# Patient Record
Sex: Female | Born: 1980 | Race: White | Hispanic: No | Marital: Married | Smoking: Never smoker
Health system: Southern US, Community
[De-identification: ages and names within clinical notes are randomized; demographics above are authoritative.]

## PROBLEM LIST (undated history)

## (undated) ENCOUNTER — Inpatient Hospital Stay (HOSPITAL_COMMUNITY): Payer: Self-pay

## (undated) HISTORY — PX: CHOLECYSTECTOMY: SHX55

---

## 1999-06-29 ENCOUNTER — Emergency Department (HOSPITAL_COMMUNITY): Admission: EM | Admit: 1999-06-29 | Discharge: 1999-06-30 | Payer: Self-pay | Admitting: *Deleted

## 1999-07-06 ENCOUNTER — Other Ambulatory Visit: Admission: RE | Admit: 1999-07-06 | Discharge: 1999-07-06 | Payer: Self-pay | Admitting: Obstetrics and Gynecology

## 2000-07-13 ENCOUNTER — Other Ambulatory Visit: Admission: RE | Admit: 2000-07-13 | Discharge: 2000-07-13 | Payer: Self-pay | Admitting: Obstetrics and Gynecology

## 2000-10-24 ENCOUNTER — Emergency Department (HOSPITAL_COMMUNITY): Admission: EM | Admit: 2000-10-24 | Discharge: 2000-10-25 | Payer: Self-pay | Admitting: Emergency Medicine

## 2001-07-20 ENCOUNTER — Other Ambulatory Visit: Admission: RE | Admit: 2001-07-20 | Discharge: 2001-07-20 | Payer: Self-pay | Admitting: Obstetrics and Gynecology

## 2001-07-22 ENCOUNTER — Emergency Department (HOSPITAL_COMMUNITY): Admission: EM | Admit: 2001-07-22 | Discharge: 2001-07-22 | Payer: Self-pay | Admitting: Emergency Medicine

## 2002-07-23 ENCOUNTER — Other Ambulatory Visit: Admission: RE | Admit: 2002-07-23 | Discharge: 2002-07-23 | Payer: Self-pay | Admitting: Obstetrics and Gynecology

## 2003-10-10 ENCOUNTER — Other Ambulatory Visit: Admission: RE | Admit: 2003-10-10 | Discharge: 2003-10-10 | Payer: Self-pay | Admitting: Obstetrics and Gynecology

## 2004-12-17 ENCOUNTER — Other Ambulatory Visit: Admission: RE | Admit: 2004-12-17 | Discharge: 2004-12-17 | Payer: Self-pay | Admitting: Obstetrics and Gynecology

## 2005-12-26 ENCOUNTER — Other Ambulatory Visit: Admission: RE | Admit: 2005-12-26 | Discharge: 2005-12-26 | Payer: Self-pay | Admitting: Obstetrics & Gynecology

## 2007-01-19 ENCOUNTER — Other Ambulatory Visit: Admission: RE | Admit: 2007-01-19 | Discharge: 2007-01-19 | Payer: Self-pay | Admitting: Obstetrics & Gynecology

## 2008-02-21 ENCOUNTER — Emergency Department (HOSPITAL_COMMUNITY): Admission: EM | Admit: 2008-02-21 | Discharge: 2008-02-21 | Payer: Self-pay | Admitting: Emergency Medicine

## 2008-03-07 ENCOUNTER — Other Ambulatory Visit: Admission: RE | Admit: 2008-03-07 | Discharge: 2008-03-07 | Payer: Self-pay | Admitting: Obstetrics and Gynecology

## 2008-03-18 ENCOUNTER — Encounter (INDEPENDENT_AMBULATORY_CARE_PROVIDER_SITE_OTHER): Payer: Self-pay | Admitting: Surgery

## 2008-03-18 ENCOUNTER — Ambulatory Visit (HOSPITAL_COMMUNITY): Admission: RE | Admit: 2008-03-18 | Discharge: 2008-03-18 | Payer: Self-pay | Admitting: Surgery

## 2010-05-31 LAB — COMPREHENSIVE METABOLIC PANEL
ALT: 18 U/L (ref 0–35)
AST: 18 U/L (ref 0–37)
Alkaline Phosphatase: 57 U/L (ref 39–117)
CO2: 24 mEq/L (ref 19–32)
Chloride: 100 mEq/L (ref 96–112)
GFR calc Af Amer: >60 mL/min (ref >60)
GFR calc non Af Amer: >60 mL/min (ref >60)
Glucose, Bld: 125 mg/dL — ABNORMAL HIGH (ref 70–99)
Potassium: 3.8 mEq/L (ref 3.5–5.1)
Sodium: 133 mEq/L — ABNORMAL LOW (ref 135–145)
Total Bilirubin: 0.5 mg/dL (ref 0.3–1.2)

## 2010-05-31 LAB — URINALYSIS, ROUTINE W REFLEX MICROSCOPIC
Bilirubin Urine: NEGATIVE
Glucose, UA: NEGATIVE mg/dL
Ketones, ur: 15 mg/dL — AB
Protein, ur: NEGATIVE mg/dL
pH: 5.5 (ref 5.0–8.0)

## 2010-05-31 LAB — DIFFERENTIAL
Basophils Absolute: 0 10*3/uL (ref 0.0–0.1)
Basophils Relative: 0 % (ref 0–1)
Eosinophils Absolute: 0 10*3/uL (ref 0.0–0.7)
Eosinophils Relative: 0 % (ref 0–5)
Lymphs Abs: 1.9 10*3/uL (ref 0.7–4.0)
Neutrophils Relative %: 79 % — ABNORMAL HIGH (ref 43–77)

## 2010-05-31 LAB — CBC
Hemoglobin: 14.2 g/dL (ref 12.0–15.0)
MCHC: 34.3 g/dL (ref 30.0–36.0)
RBC: 4.34 MIL/uL (ref 3.87–5.11)
WBC: 12.5 10*3/uL — ABNORMAL HIGH (ref 4.0–10.5)

## 2010-05-31 LAB — LIPASE, BLOOD: Lipase: 24 U/L (ref 11–59)

## 2010-05-31 LAB — URINE MICROSCOPIC-ADD ON

## 2010-05-31 LAB — PREGNANCY, URINE: Preg Test, Ur: NEGATIVE

## 2010-06-01 LAB — COMPREHENSIVE METABOLIC PANEL
Albumin: 3.4 g/dL — ABNORMAL LOW (ref 3.5–5.2)
BUN: 9 mg/dL (ref 6–23)
Calcium: 9 mg/dL (ref 8.4–10.5)
Chloride: 109 mEq/L (ref 96–112)
Creatinine, Ser: 0.82 mg/dL (ref 0.4–1.2)
GFR calc Af Amer: >60 mL/min (ref >60)
Total Bilirubin: 0.4 mg/dL (ref 0.3–1.2)

## 2010-06-01 LAB — URINALYSIS, ROUTINE W REFLEX MICROSCOPIC
Bilirubin Urine: NEGATIVE
Nitrite: NEGATIVE
Protein, ur: NEGATIVE mg/dL
Urobilinogen, UA: 0.2 mg/dL (ref 0.0–1.0)

## 2010-06-01 LAB — LIPASE, BLOOD: Lipase: 26 U/L (ref 11–59)

## 2010-06-01 LAB — URINE MICROSCOPIC-ADD ON

## 2010-06-01 LAB — PREGNANCY, URINE: Preg Test, Ur: NEGATIVE

## 2010-06-01 LAB — HEMOGLOBIN AND HEMATOCRIT, BLOOD: HCT: 41.1 % (ref 36.0–46.0)

## 2010-06-15 ENCOUNTER — Other Ambulatory Visit: Payer: Self-pay | Admitting: Dermatology

## 2010-06-29 NOTE — Op Note (Signed)
Karen Tapia, Karen Tapia                 ACCOUNT NO.:  [id number]   MEDICAL RECORD NO.:  [id number]          PATIENT TYPE:  AMB   LOCATION:  DAY                          FACILITY:  [facility]   PHYSICIAN:  Currie Paris, M.D.DATE OF BIRTH:  March 07, 1980   DATE OF PROCEDURE:  DATE OF DISCHARGE:  03/18/2008                               OPERATIVE REPORT   PREOPERATIVE DIAGNOSIS:  Chronic calculus cholecystitis.   POSTOPERATIVE DIAGNOSIS:  Chronic calculus cholecystitis.   OPERATION:  Laparoscopic cholecystectomy with operative cholangiogram.   ASSISTANT:  Dr. Zachery Dakins.   ANESTHESIA:  General endotracheal.   CLINICAL HISTORY:  This is a 27-year lady with recurring episodes of  biliary colic and stones seen on ultrasound.  We elected to proceed to a  cholecystectomy.   DESCRIPTION OF PROCEDURE:  The patient was seen in the holding area and  she had no further questions.  We confirmed that laparoscopic  cholecystectomy with cholangiogram was the planned procedure.   The patient taken to the operating room after satisfactory general  endotracheal anesthesia had been obtained.  The abdomen was prepped and  draped.  The time-out was done.   I used 0.25% plain Marcaine for each incision.  The umbilical incision  was made, the fascia identified and opened and the peritoneal cavity  entered under direct vision.  Pursestring was placed, the Hasson  introduced and the abdomen insufflated 15.   The gallbladder was contracted, but did not appear acutely inflamed,  although subsequently we noticed a little bit of edema in the wall.  There appeared to be a stone near the cystic duct.   The patient was placed in reverse Trendelenburg and tilted to the left.  A 10/11 trocar was placed in the epigastrium and two 5 mm trocars  laterally, all under direct vision.  The gallbladder was retracted and  the peritoneum over the cystic duct opened.  I dissected out, identified  the cystic duct and cystic  artery and put one clip on each.   The cystic duct was opened just beyond the clip and operative  angiography done showing good filling of the common duct and hepatic  radicals and good emptying into the duodenum with no filling defects.   The cystic duct catheter was removed and three clips placed on the stay  side of the cystic duct and it was divided.  Two additional clips were  placed on the cystic artery and it was divided.  Some other smaller  branches of the cystic artery clipped as we removed the gallbladder.   The gallbladder was removed from below to above with coagulation by  electrocautery.  We did spill distal little bit of bile as it was being  removed through the cystic duct where the clip came off, but this was  occluded and there was minimal spillage.   The gallbladder was placed in a bag and we did a final irrigation, final  check for hemostasis.  The gallbladder was then brought out the  umbilical port.  Another check for hemostasis was made and we made sure  all the irrigation  was sucked out.  The lateral ports were removed under  direct vision.  The umbilical site was closed with a pursestring.  The  abdomen was deflated through the epigastric site.  Skin was closed with  4-0 Monocryl subcuticular plus Dermabond.   The patient tolerated the procedure well.  There were no complications.  All counts were correct.      Currie Paris, [name]  Electronically Signed     CJS/MEDQ  D:  03/18/2008  T:  03/19/2008  Job:  [id number]   cc:   Vikki Ports, [name]  Fax: [phone]

## 2010-06-30 ENCOUNTER — Other Ambulatory Visit: Payer: Self-pay | Admitting: Dermatology

## 2010-07-30 ENCOUNTER — Inpatient Hospital Stay (HOSPITAL_COMMUNITY)
Admission: AD | Admit: 2010-07-30 | Discharge: 2010-08-04 | DRG: 370 | Disposition: A | Discharge status: Home / Self Care | Payer: BC Managed Care – PPO | Source: Ambulatory Visit | Attending: Obstetrics and Gynecology | Admitting: Obstetrics and Gynecology

## 2010-07-30 ENCOUNTER — Encounter (HOSPITAL_COMMUNITY): Payer: Self-pay | Admitting: *Deleted

## 2010-07-30 DIAGNOSIS — O339 Maternal care for disproportion, unspecified: Secondary | ICD-10-CM | POA: Diagnosis present

## 2010-07-30 DIAGNOSIS — O909 Complication of the puerperium, unspecified: Secondary | ICD-10-CM | POA: Diagnosis not present

## 2010-07-30 DIAGNOSIS — L02219 Cutaneous abscess of trunk, unspecified: Secondary | ICD-10-CM | POA: Diagnosis not present

## 2010-07-30 DIAGNOSIS — O330 Maternal care for disproportion due to deformity of maternal pelvic bones: Principal | ICD-10-CM | POA: Diagnosis present

## 2010-07-30 LAB — CBC
HCT: 38.8 % (ref 36.0–46.0)
MCV: 95.1 fL (ref 78.0–100.0)
RBC: 4.08 MIL/uL (ref 3.87–5.11)
WBC: 9.7 10*3/uL (ref 4.0–10.5)

## 2010-08-01 ENCOUNTER — Other Ambulatory Visit: Payer: Self-pay | Admitting: Obstetrics and Gynecology

## 2010-08-02 LAB — CBC
HCT: 27.2 % — ABNORMAL LOW (ref 36.0–46.0)
Hemoglobin: 9 g/dL — ABNORMAL LOW (ref 12.0–15.0)
MCV: 95.8 fL (ref 78.0–100.0)
RBC: 2.84 MIL/uL — ABNORMAL LOW (ref 3.87–5.11)
WBC: 16.4 10*3/uL — ABNORMAL HIGH (ref 4.0–10.5)

## 2010-08-04 ENCOUNTER — Encounter (HOSPITAL_COMMUNITY)
Admission: RE | Admit: 2010-08-04 | Discharge: 2010-08-04 | Disposition: A | Discharge status: Home / Self Care | Payer: BC Managed Care – PPO | Source: Ambulatory Visit | Attending: Obstetrics and Gynecology | Admitting: Obstetrics and Gynecology

## 2010-08-04 DIAGNOSIS — O923 Agalactia: Secondary | ICD-10-CM | POA: Insufficient documentation

## 2010-08-04 NOTE — Op Note (Signed)
  [name], [name]                 ACCOUNT NO.:  [id number]  MEDICAL RECORD NO.:  [id number]  LOCATION:  9119                          FACILITY:  [facility]  PHYSICIAN:  Duke Salvia. Marcelle Overlie, M.D.DATE OF BIRTH:  [birth year]  DATE OF PROCEDURE:  08/01/2010 DATE OF DISCHARGE:                              OPERATIVE REPORT   PREOPERATIVE DIAGNOSIS:  Cephalopelvic disproportion.  POSTOPERATIVE DIAGNOSIS:  Cephalopelvic disproportion plus OP presentation.  PROCEDURE:  Primary low transverse cesarean section.  SURGEON:  Duke Salvia. Marcelle Overlie, MD  ANESTHESIA:  Epidural.  COMPLICATIONS:  None.  DRAINS:  Foley catheter.  BLOOD LOSS:  800.  INDICATIONS:  This patient got to 9 cm dilated with persistent station - 2, failure to progress beyond that point with increasing kappa, decision made to proceed with primary CS for CPD.  PROCEDURE AND FINDINGS:  The patient was taken to the operating room after an adequate level of epidural anesthesia was obtained with the patient in the left tilt position.  The abdomen prepped and draped in usual manner for sterile abdominal procedures.  Foley catheter was already positioned.  Transverse incision made 2 fingerbreadths above the symphysis, carried down to the fascia which was incised and extended transversely.  Rectus muscle divided to the midline.  Peritoneum entered superiorly without incident and extended in a vertical fashion.  The vesicouterine serosa was then incised and the bladder was bluntly and sharply dissected below.  Transverse incision made, extended with blunt dissection.  The straight OP presentation was noted.  The patient delivered of a healthy female, 8 pounds 2 ounces from the OP presentation. Cord pH was sent.  The infant was suctioned, cord clamped and passed to pediatric team for further care.  Placenta was then delivered manually intact.  Uterus exteriorized, cavity wiped, clean with laparotomy pack. Closure obtained for first layer  of O chromic in a locked fashion followed by an imbricating layer of O chromic.  This was inspected carefully, noted to be hemostatic.  The bladder area was intact and hemostatic.  Clear urine noted at that point.  Bilateral tubes and ovaries were normal.  Prior to closure, sponge, needle, and instrument counts were reported as correct x2.  Peritoneum closed with a running 2- 0 Vicryl suture.  A 2-0 Vicryl suture interrupted suture was used to close the rectus muscles in the midline.  A looped 1 PDS suture was then used to close the fascia transversely.  The subcutaneous tissue was irrigated, noted to be hemostatic.  A running 3-0 Vicryl suture was used to close the subcutaneous dead space, 4-0 Monocryl subcuticular skin closure, and sterile dressing applied.  She did receive Ancef 1 gram IV preop and Pitocin IV after the cord was clamped.  Mother and baby were doing well at that point.     Amare Kontos M. Marcelle Overlie, [name]     RMH/MEDQ  D:  08/01/2010  T:  08/01/2010  Job:  [id number]  Electronically Signed by Richarda Overlie [name] on 08/04/2010 09:10:47 AM

## 2010-08-06 ENCOUNTER — Inpatient Hospital Stay (HOSPITAL_COMMUNITY): Admission: AD | Admit: 2010-08-06 | Payer: Self-pay | Source: Ambulatory Visit | Admitting: Obstetrics and Gynecology

## 2010-08-13 NOTE — Discharge Summary (Signed)
[name], Karen Tapia                 ACCOUNT NO.:  [id number]  MEDICAL RECORD NO.:  [id number]  LOCATION:  9119                          FACILITY:  [facility]  PHYSICIAN:  [name], [name] DATE OF BIRTH:  31-Jan-1981  DATE OF ADMISSION:  07/30/2010 DATE OF DISCHARGE:  08/04/2010                              DISCHARGE SUMMARY   ADMITTING DIAGNOSIS:  Intrauterine pregnancy at term for two-stage induction of labor.  DISCHARGE DIAGNOSES: 1. Status post low-transverse cesarean section secondary to failure to     progress and maternal fever. 2. Viable female infant.  PROCEDURE:  Primary low-transverse cesarean section.  REASON FOR ADMISSION:  Please see written H and P.  HOSPITAL COURSE:  The patient is a 30 year old primigravida who was admitted to [facility] at 30 weeks' estimated gestational age for a two-stage induction of labor.  On admission, vital signs were stable.  Cervix was fingertip long and vertex presentation. The patient was started on Pitocin on the following morning after declining Cytotec the previous evening.  The patient did progress to 9 cm, completely effaced with vertex at -2 station with caput developing. Decision was made to proceed with a primary low-transverse cesarean section.  The patient was then taken to the operating room where epidural was dosed to an adequate surgical level.  A low-transverse incision was made with delivery of a viable female infant weighing 8 pounds 2 ounces with Apgars of 8 at one minute and 9 at five minutes. Arterial cord pH of 7.30.  The patient tolerated procedure well and was taken to the recovery room in a stable condition.  On postoperative day #1, the patient was without complaint.  Vital signs were stable. Abdominal dressing noted to be clean, dry, and intact.  Output was adequate.  Hemoglobin was 9.0.  On postoperative day #2, the patient was without complaint.  Vital signs were stable.  Abdomen soft.  Fundus  firm and nontender.  Incision was clean, dry, and intact with subcuticular closure.  She was ambulating well.  On postoperative day #3, the patient was ready for discharge.  Vital signs were stable.  Abdomen soft. Fundus firm and nontender.  Incision was clean, dry, and intact.  Little erythema was noted superior to the incisional site with slight warmness noted.  No evidence of seroma was seen.  The patient was ambulating well.  Discharge instructions were reviewed and the patient was later discharged home.  CONDITION ON DISCHARGE:  Stable.  DIET:  Regular as tolerated.  ACTIVITY:  No heavy lifting, no driving x2 weeks, no vaginal entry.  FOLLOWUP:  The patient to follow up in the office in 1 week for an incision check.  She is to call for temperature greater than 100 degrees, persistent vomiting, heavy vaginal bleeding, or for further redness or drainage from the incisional site.  DISCHARGE MEDICATIONS: 1. Augmentin 875 mg one p.o. b.i.d. 2. Percocet 5/325 #30 one p.o. every 4 to 6 hours p.r.n. 3. Motrin 600 mg every 6 hours. 4. Prenatal vitamins one p.o. daily.     [name], [name]   ______________________________ [name], [name]    CC/MEDQ  D:  08/04/2010  T:  08/04/2010  Job:  [id number]  Electronically Signed by [name] [name] on 08/05/2010 09:35:02 AM Electronically Signed by [name] on 08/13/2010 09:17:18 AM

## 2010-10-11 ENCOUNTER — Other Ambulatory Visit: Payer: Self-pay | Admitting: Dermatology

## 2011-04-13 ENCOUNTER — Other Ambulatory Visit: Payer: Self-pay | Admitting: Dermatology

## 2012-12-04 LAB — OB RESULTS CONSOLE RPR: RPR: NONREACTIVE

## 2012-12-04 LAB — OB RESULTS CONSOLE GC/CHLAMYDIA
Chlamydia: NEGATIVE
GC PROBE AMP, GENITAL: NEGATIVE

## 2012-12-04 LAB — OB RESULTS CONSOLE ABO/RH: RH Type: POSITIVE

## 2012-12-04 LAB — OB RESULTS CONSOLE HEPATITIS B SURFACE ANTIGEN: HEP B S AG: NEGATIVE

## 2012-12-04 LAB — OB RESULTS CONSOLE RUBELLA ANTIBODY, IGM: RUBELLA: IMMUNE

## 2012-12-04 LAB — OB RESULTS CONSOLE HIV ANTIBODY (ROUTINE TESTING): HIV: NONREACTIVE

## 2012-12-04 LAB — OB RESULTS CONSOLE ANTIBODY SCREEN: ANTIBODY SCREEN: NEGATIVE

## 2012-12-31 ENCOUNTER — Other Ambulatory Visit: Payer: Self-pay

## 2013-04-09 ENCOUNTER — Other Ambulatory Visit: Payer: Self-pay | Admitting: Dermatology

## 2013-05-22 ENCOUNTER — Other Ambulatory Visit (HOSPITAL_COMMUNITY): Payer: Self-pay | Admitting: Obstetrics & Gynecology

## 2013-05-22 DIAGNOSIS — O409XX0 Polyhydramnios, unspecified trimester, not applicable or unspecified: Secondary | ICD-10-CM

## 2013-05-24 ENCOUNTER — Encounter (HOSPITAL_COMMUNITY): Payer: Self-pay

## 2013-05-24 ENCOUNTER — Inpatient Hospital Stay (HOSPITAL_COMMUNITY): Payer: BC Managed Care – PPO

## 2013-05-24 ENCOUNTER — Inpatient Hospital Stay (HOSPITAL_COMMUNITY)
Admission: AD | Admit: 2013-05-24 | Discharge: 2013-05-24 | Disposition: A | Discharge status: Home / Self Care | Payer: BC Managed Care – PPO | Source: Ambulatory Visit | Attending: Obstetrics and Gynecology | Admitting: Obstetrics and Gynecology

## 2013-05-24 DIAGNOSIS — N898 Other specified noninflammatory disorders of vagina: Secondary | ICD-10-CM | POA: Insufficient documentation

## 2013-05-24 DIAGNOSIS — O26853 Spotting complicating pregnancy, third trimester: Secondary | ICD-10-CM

## 2013-05-24 DIAGNOSIS — O26859 Spotting complicating pregnancy, unspecified trimester: Secondary | ICD-10-CM

## 2013-05-24 LAB — CBC
HEMATOCRIT: 36 % (ref 36.0–46.0)
HEMOGLOBIN: 12.1 g/dL (ref 12.0–15.0)
MCH: 31.7 pg (ref 26.0–34.0)
MCHC: 33.6 g/dL (ref 30.0–36.0)
MCV: 94.2 fL (ref 78.0–100.0)
Platelets: 221 10*3/uL (ref 150–400)
RBC: 3.82 MIL/uL — ABNORMAL LOW (ref 3.87–5.11)
RDW: 14.8 % (ref 11.5–15.5)
WBC: 10.2 10*3/uL (ref 4.0–10.5)

## 2013-05-24 LAB — URINALYSIS, ROUTINE W REFLEX MICROSCOPIC
Bilirubin Urine: NEGATIVE
Glucose, UA: NEGATIVE mg/dL
KETONES UR: NEGATIVE mg/dL
LEUKOCYTES UA: NEGATIVE
Nitrite: NEGATIVE
PROTEIN: NEGATIVE mg/dL
Specific Gravity, Urine: 1.01 (ref 1.005–1.030)
UROBILINOGEN UA: 0.2 mg/dL (ref 0.0–1.0)
pH: 6.5 (ref 5.0–8.0)

## 2013-05-24 LAB — URINE MICROSCOPIC-ADD ON

## 2013-05-24 LAB — WET PREP, GENITAL
CLUE CELLS WET PREP: NONE SEEN
Trich, Wet Prep: NONE SEEN
Yeast Wet Prep HPF POC: NONE SEEN

## 2013-05-24 NOTE — Discharge Instructions (Signed)
Vaginal Bleeding During Pregnancy, Third Trimester A small amount of bleeding (spotting) from the vagina is relatively common in pregnancy. Various things can cause bleeding or spotting in pregnancy. Sometimes the bleeding is normal and is not a problem. However, bleeding during the third trimester can also be a sign of something serious for the mother and the baby. Be sure to tell your health care provider about any vaginal bleeding right away.  Some possible causes of vaginal bleeding during the third trimester include:   The placenta may be partially or completely covering the opening to the cervix (placenta previa).   The placenta may have separated from the uterus (abruption of the placenta).   There may be an infection or growth on the cervix.   You may be starting labor, called discharging of the mucus plug.   The placenta may grow into the muscle layer of the uterus (placenta accreta).  HOME CARE INSTRUCTIONS  Watch your condition for any changes. The following actions may help to lessen any discomfort you are feeling:   Continue with normal activities.   Keep track of the number of pads you use each day, how often you change pads, and how soaked (saturated) they are. Write this down.  Do not use tampons. Do not douche.  Do not have sexual intercourse or orgasms until approved by your health care provider.  Follow your health care provider's advice about lifting, driving, and physical activities.  If you pass any tissue from your vagina, save the tissue so you can show it to your health care provider.   Only take over-the-counter or prescription medicines as directed by your health care provider.  Do not take aspirin because it can make you bleed.   Keep all follow-up appointments as directed by your health care provider. SEEK MEDICAL CARE IF:  You have any vaginal bleeding during any part of your pregnancy.  You have cramps or labor pains. SEEK IMMEDIATE MEDICAL  CARE IF:   You have severe cramps or pain in your back or belly (abdomen).  You have a fever, not controlled by medicine.  You have chills.  You have a gush of fluid from the vagina.  You pass large clots or tissue from your vagina.  Your bleeding increases.  You feel lightheaded or weak.  You pass out.  You feel less movement or no movement of the baby.  MAKE SURE YOU:  Understand these instructions.  Will watch your condition.  Will get help right away if you are not doing well or get worse. Document Released: 04/23/2002 Document Revised: 11/21/2012 Document Reviewed: 10/08/2012 [facility] Patient Information 2014 [place], [place].

## 2013-05-24 NOTE — MAU Note (Signed)
Patient states she passed some brown discharge with clear mucus at 1330. Denies contractions, leaking or bleeding. States she has an increase in the amount of amniotic.

## 2013-05-24 NOTE — MAU Provider Note (Signed)
History     CSN: [id number]  Arrival date and time: 05/24/13 1525   First Provider Initiated Contact with Patient 05/24/13 1619      Chief Complaint  Patient presents with  . Vaginal Discharge   Vaginal Discharge The patient's primary symptoms include a vaginal discharge.    Karen Tapia is a 33 y.o. G2P1001 at [redacted]w[redacted]d who presents today with brown spotting. She states that she first noticed it around 1330. She denies any pain, contractions or LOF. She states that she has not had intercourse in the last 48 hours. She states that the baby has been moving normally. She was recently diagnosed with polyhydramnios, and has an appointment next week in the office to start antenatal testing. She denies any other complications with this pregnancy.    History reviewed. No pertinent past medical history.  Past Surgical History  Procedure Laterality Date  . Cesarean section    . Cholecystectomy      History reviewed. No pertinent family history.  History  Substance Use Topics  . Smoking status: Never Smoker   . Smokeless tobacco: Never Used  . Alcohol Use: No    Allergies: No Known Allergies  Prescriptions prior to admission  Medication Sig Dispense Refill  . acetaminophen (TYLENOL) 325 MG tablet Take 650 mg by mouth every 6 (six) hours as needed for moderate pain.      Marland Kitchen loratadine (CLARITIN) 10 MG tablet Take 10 mg by mouth as needed for allergies.      . Prenatal Vit-Fe Fumarate-FA (PRENATAL MULTIVITAMIN) TABS tablet Take 1 tablet by mouth daily at 12 noon.        Review of Systems  Genitourinary: Positive for vaginal discharge.   Physical Exam   Blood pressure 116/75, pulse 103, temperature 98.7 F (37.1 C), temperature source Oral, resp. rate 16, height 5\' 2"  (1.575 m), weight 112.855 kg (248 lb 12.8 oz), SpO2 99.00%.  Physical Exam  Nursing note and vitals reviewed. Constitutional: She is oriented to person, place, and time. She appears well-developed and  well-nourished. No distress.  Cardiovascular: Normal rate.   Respiratory: Effort normal.  GI: Soft. There is no tenderness.  Genitourinary:   External: no lesion Vagina: small amount of white/tan discharge Cervix: pink, smooth, closed/thick/high Uterus: AGA    Neurological: She is alert and oriented to person, place, and time.  Skin: Skin is warm and dry.  Psychiatric: She has a normal mood and affect.   FHT: 145, moderate variability with 15x15 accels, no decels, Cat I Toco: no UCs  MAU Course  Procedures  Results for orders placed during the hospital encounter of 05/24/13 (from the past 24 hour(s))  URINALYSIS, ROUTINE W REFLEX MICROSCOPIC     Status: Abnormal   Collection Time    05/24/13  3:30 PM      Result Value Ref Range   Color, Urine YELLOW  YELLOW   APPearance CLEAR  CLEAR   Specific Gravity, Urine 1.010  1.005 - 1.030   pH 6.5  5.0 - 8.0   Glucose, UA NEGATIVE  NEGATIVE mg/dL   Hgb urine dipstick TRACE (*) NEGATIVE   Bilirubin Urine NEGATIVE  NEGATIVE   Ketones, ur NEGATIVE  NEGATIVE mg/dL   Protein, ur NEGATIVE  NEGATIVE mg/dL   Urobilinogen, UA 0.2  0.0 - 1.0 mg/dL   Nitrite NEGATIVE  NEGATIVE   Leukocytes, UA NEGATIVE  NEGATIVE  URINE MICROSCOPIC-ADD ON     Status: Abnormal   Collection Time    05/24/13  3:30 PM      Result Value Ref Range   Squamous Epithelial / LPF FEW (*) RARE   WBC, UA 3-6  <3 WBC/hpf   RBC / HPF 0-2  <3 RBC/hpf   Bacteria, UA FEW (*) RARE  WET PREP, GENITAL     Status: Abnormal   Collection Time    05/24/13  4:20 PM      Result Value Ref Range   Yeast Wet Prep HPF POC NONE SEEN  NONE SEEN   Trich, Wet Prep NONE SEEN  NONE SEEN   Clue Cells Wet Prep HPF POC NONE SEEN  NONE SEEN   WBC, Wet Prep HPF POC MANY (*) NONE SEEN  CBC     Status: Abnormal   Collection Time    05/24/13  5:05 PM      Result Value Ref Range   WBC 10.2  4.0 - 10.5 K/uL   RBC 3.82 (*) 3.87 - 5.11 MIL/uL   Hemoglobin 12.1  12.0 - 15.0 g/dL   HCT 16.136.0   09.636.0 - 04.546.0 %   MCV 94.2  78.0 - 100.0 fL   MCH 31.7  26.0 - 34.0 pg   MCHC 33.6  30.0 - 36.0 g/dL   RDW 40.914.8  81.111.5 - 91.415.5 %   Platelets 221  150 - 400 K/uL    1656: Called [facility] will send patient for US. If US is normal patient may be DC home.  1758: US shows AFI: 22.04 (85%tile). Placenta appears normal. Assessment and Plan   1. Spotting complicating pregnancy in third trimester, antepartum    Bleeding precautions reviewed Fetal kick counts Return to MAU as needed Keep all scheduled appointments and ultrasound appointments.   Tawnya CrookHeather Donovan Hogan 05/24/2013, 5:59 PM

## 2013-05-29 ENCOUNTER — Other Ambulatory Visit (HOSPITAL_COMMUNITY): Payer: Self-pay | Admitting: Obstetrics and Gynecology

## 2013-05-29 ENCOUNTER — Ambulatory Visit (HOSPITAL_COMMUNITY)
Admission: RE | Admit: 2013-05-29 | Discharge: 2013-05-29 | Disposition: A | Discharge status: Home / Self Care | Payer: BC Managed Care – PPO | Source: Ambulatory Visit | Attending: Obstetrics & Gynecology | Admitting: Obstetrics & Gynecology

## 2013-05-29 ENCOUNTER — Encounter (HOSPITAL_COMMUNITY): Payer: Self-pay

## 2013-05-29 DIAGNOSIS — O358XX0 Maternal care for other (suspected) fetal abnormality and damage, not applicable or unspecified: Secondary | ICD-10-CM | POA: Insufficient documentation

## 2013-05-29 DIAGNOSIS — Z363 Encounter for antenatal screening for malformations: Secondary | ICD-10-CM | POA: Insufficient documentation

## 2013-05-29 DIAGNOSIS — O409XX0 Polyhydramnios, unspecified trimester, not applicable or unspecified: Secondary | ICD-10-CM

## 2013-05-29 DIAGNOSIS — O34219 Maternal care for unspecified type scar from previous cesarean delivery: Secondary | ICD-10-CM | POA: Insufficient documentation

## 2013-05-29 DIAGNOSIS — O09529 Supervision of elderly multigravida, unspecified trimester: Secondary | ICD-10-CM | POA: Insufficient documentation

## 2013-05-29 DIAGNOSIS — Z1389 Encounter for screening for other disorder: Secondary | ICD-10-CM | POA: Insufficient documentation

## 2013-06-26 ENCOUNTER — Encounter (HOSPITAL_COMMUNITY): Payer: Self-pay

## 2013-06-26 ENCOUNTER — Ambulatory Visit (HOSPITAL_COMMUNITY)
Admission: RE | Admit: 2013-06-26 | Discharge: 2013-06-26 | Disposition: A | Discharge status: Home / Self Care | Payer: BC Managed Care – PPO | Source: Ambulatory Visit | Attending: Obstetrics and Gynecology | Admitting: Obstetrics and Gynecology

## 2013-06-26 DIAGNOSIS — O409XX0 Polyhydramnios, unspecified trimester, not applicable or unspecified: Secondary | ICD-10-CM

## 2013-06-26 DIAGNOSIS — Z3689 Encounter for other specified antenatal screening: Secondary | ICD-10-CM | POA: Insufficient documentation

## 2013-07-02 ENCOUNTER — Encounter (HOSPITAL_COMMUNITY): Payer: Self-pay | Admitting: Pharmacist

## 2013-07-10 NOTE — H&P (Addendum)
Karen Tapia is a 33 y.o. female presenting for repeat c-section. History OB History   Grav Para Term Preterm Abortions TAB SAB Ect Mult Living   2 1 1       1      No past medical history on file. Past Surgical History  Procedure Laterality Date  . Cesarean section    . Cholecystectomy     Family History: family history is not on file. Social History:  reports that she has never smoked. She has never used smokeless tobacco. She reports that she does not drink alcohol or use illicit drugs.   Prenatal Transfer Tool  Maternal Diabetes: No Genetic Screening: Normal Maternal Ultrasounds/Referrals: Normal Fetal Ultrasounds or other Referrals:  Other:  AC >90% Maternal Substance Abuse:  No Significant Maternal Medications:  None Significant Maternal Lab Results:  None Other Comments:  None  ROS    Last menstrual period 10/06/2012. Exam Physical Exam  Gen - NAD Abd - gravid, NT Ext - NT  Prenatal labs: ABO, Rh: O/Positive/-- (10/21 0000) Antibody: Negative (10/21 0000) Rubella: Immune (10/21 0000) RPR: Nonreactive (10/21 0000)  HBsAg: Negative (10/21 0000)  HIV: Non-reactive (10/21 0000)  GBS:     Assessment/Plan: Admit Repeat c-section Placenta to pathology - h/o melanoma R/b/a discussed, questions answered, informed consent   Zelphia Cairo 07/10/2013, 7:22 PM

## 2013-07-11 ENCOUNTER — Encounter (HOSPITAL_COMMUNITY): Payer: Self-pay

## 2013-07-11 ENCOUNTER — Encounter (HOSPITAL_COMMUNITY)
Admission: RE | Admit: 2013-07-11 | Discharge: 2013-07-11 | Disposition: A | Discharge status: Home / Self Care | Payer: BC Managed Care – PPO | Source: Ambulatory Visit | Attending: Obstetrics and Gynecology | Admitting: Obstetrics and Gynecology

## 2013-07-11 LAB — CBC
HCT: 36.1 % (ref 36.0–46.0)
Hemoglobin: 12.1 g/dL (ref 12.0–15.0)
MCH: 31.8 pg (ref 26.0–34.0)
MCHC: 33.5 g/dL (ref 30.0–36.0)
MCV: 94.8 fL (ref 78.0–100.0)
Platelets: 193 10*3/uL (ref 150–400)
RBC: 3.81 MIL/uL — ABNORMAL LOW (ref 3.87–5.11)
RDW: 14.8 % (ref 11.5–15.5)
WBC: 6.8 10*3/uL (ref 4.0–10.5)

## 2013-07-11 LAB — TYPE AND SCREEN
ABO/RH(D): O POS
Antibody Screen: NEGATIVE

## 2013-07-11 LAB — RPR

## 2013-07-11 MED ORDER — DEXTROSE 5 % IV SOLN
3.0000 g | INTRAVENOUS | Status: AC
  Administered 2013-07-12: 3 g via INTRAVENOUS
  Filled 2013-07-11: qty 3000

## 2013-07-11 NOTE — Patient Instructions (Addendum)
   Your procedure is scheduled on: Jul 12, 2013  Enter through the Main Entrance of [facility] at:0600 Pick up the phone at the desk and dial [phone] and inform us of your arrival.  Please call this number if you have any problems the morning of surgery: [phone]  Remember: Do not eat food after midnight: Do not drink clear liquids after: Take these medicines the morning of surgery with a SIP OF WATER:  Do not wear jewelry, make-up, or FINGER nail polish No metal in your hair or on your body. Do not wear lotions, powders, perfumes.  You may wear deodorant.  Do not bring valuables to the hospital. Contacts, dentures or bridgework may not be worn into surgery.  Leave suitcase in the car. After Surgery it may be brought to your room. For patients being admitted to the hospital, checkout time is 11:00am the day of discharge.    Patients discharged on the day of surgery will not be allowed to drive home.

## 2013-07-12 ENCOUNTER — Inpatient Hospital Stay (HOSPITAL_COMMUNITY): Payer: BC Managed Care – PPO | Admitting: Anesthesiology

## 2013-07-12 ENCOUNTER — Encounter (HOSPITAL_COMMUNITY)
Admission: RE | Disposition: A | Discharge status: Home / Self Care | Payer: Self-pay | Source: Ambulatory Visit | Attending: Obstetrics and Gynecology

## 2013-07-12 ENCOUNTER — Encounter (HOSPITAL_COMMUNITY): Payer: Self-pay | Admitting: Anesthesiology

## 2013-07-12 ENCOUNTER — Inpatient Hospital Stay (HOSPITAL_COMMUNITY)
Admission: RE | Admit: 2013-07-12 | Discharge: 2013-07-14 | DRG: 765 | Disposition: A | Discharge status: Home / Self Care | Payer: BC Managed Care – PPO | Source: Ambulatory Visit | Attending: Obstetrics and Gynecology | Admitting: Obstetrics and Gynecology

## 2013-07-12 ENCOUNTER — Encounter (HOSPITAL_COMMUNITY): Payer: BC Managed Care – PPO | Admitting: Anesthesiology

## 2013-07-12 DIAGNOSIS — Z6841 Body Mass Index (BMI) 40.0 and over, adult: Secondary | ICD-10-CM

## 2013-07-12 DIAGNOSIS — E669 Obesity, unspecified: Secondary | ICD-10-CM | POA: Diagnosis present

## 2013-07-12 DIAGNOSIS — O34219 Maternal care for unspecified type scar from previous cesarean delivery: Principal | ICD-10-CM | POA: Diagnosis present

## 2013-07-12 DIAGNOSIS — O99214 Obesity complicating childbirth: Secondary | ICD-10-CM

## 2013-07-12 DIAGNOSIS — Z98891 History of uterine scar from previous surgery: Secondary | ICD-10-CM

## 2013-07-12 SURGERY — Surgical Case
Anesthesia: Spinal | Site: Abdomen

## 2013-07-12 MED ORDER — OXYTOCIN 40 UNITS IN LACTATED RINGERS INFUSION - SIMPLE MED: 62.5000 mL/h | INTRAVENOUS | Status: AC

## 2013-07-12 MED ORDER — LACTATED RINGERS IV BOLUS (SEPSIS)
500.0000 mL | Freq: Once | INTRAVENOUS | Status: AC
  Administered 2013-07-12: 500 mL via INTRAVENOUS

## 2013-07-12 MED ORDER — OXYTOCIN 10 UNIT/ML IJ SOLN
INTRAMUSCULAR | Status: AC
  Filled 2013-07-12: qty 4

## 2013-07-12 MED ORDER — SIMETHICONE 80 MG PO CHEW
80.0000 mg | CHEWABLE_TABLET | Freq: Three times a day (TID) | ORAL | Status: DC
  Administered 2013-07-12 – 2013-07-14 (×5): 80 mg via ORAL
  Filled 2013-07-12 (×5): qty 1

## 2013-07-12 MED ORDER — LACTATED RINGERS IV SOLN
Freq: Once | INTRAVENOUS | Status: AC
  Administered 2013-07-12: 07:00:00 via INTRAVENOUS

## 2013-07-12 MED ORDER — KETOROLAC TROMETHAMINE 30 MG/ML IJ SOLN: 30.0000 mg | Freq: Four times a day (QID) | INTRAMUSCULAR | Status: AC | PRN

## 2013-07-12 MED ORDER — WITCH HAZEL-GLYCERIN EX PADS: 1.0000 "application " | MEDICATED_PAD | CUTANEOUS | Status: DC | PRN

## 2013-07-12 MED ORDER — MORPHINE SULFATE 0.5 MG/ML IJ SOLN
INTRAMUSCULAR | Status: AC
  Filled 2013-07-12: qty 10

## 2013-07-12 MED ORDER — SODIUM CHLORIDE 0.9 % IJ SOLN: 3.0000 mL | INTRAMUSCULAR | Status: DC | PRN

## 2013-07-12 MED ORDER — PHENYLEPHRINE 8 MG IN D5W 100 ML (0.08MG/ML) PREMIX OPTIME
INJECTION | INTRAVENOUS | Status: DC | PRN
  Administered 2013-07-12: 50 ug/min via INTRAVENOUS

## 2013-07-12 MED ORDER — DIPHENHYDRAMINE HCL 25 MG PO CAPS
25.0000 mg | ORAL_CAPSULE | ORAL | Status: DC | PRN
Start: 2013-07-12 — End: 2013-07-14

## 2013-07-12 MED ORDER — FENTANYL CITRATE 0.05 MG/ML IJ SOLN
INTRAMUSCULAR | Status: DC | PRN
  Administered 2013-07-12: 25 ug via INTRATHECAL

## 2013-07-12 MED ORDER — KETOROLAC TROMETHAMINE 60 MG/2ML IM SOLN
INTRAMUSCULAR | Status: AC
  Administered 2013-07-12: 60 mg via INTRAMUSCULAR
  Filled 2013-07-12: qty 2

## 2013-07-12 MED ORDER — PHENYLEPHRINE 8 MG IN D5W 100 ML (0.08MG/ML) PREMIX OPTIME
INJECTION | INTRAVENOUS | Status: AC
  Filled 2013-07-12: qty 100

## 2013-07-12 MED ORDER — ONDANSETRON HCL 4 MG/2ML IJ SOLN
INTRAMUSCULAR | Status: DC | PRN
  Administered 2013-07-12: 4 mg via INTRAVENOUS

## 2013-07-12 MED ORDER — LACTATED RINGERS IV SOLN
INTRAVENOUS | Status: DC | PRN
  Administered 2013-07-12 (×2): via INTRAVENOUS

## 2013-07-12 MED ORDER — SCOPOLAMINE 1 MG/3DAYS TD PT72
1.0000 | MEDICATED_PATCH | Freq: Once | TRANSDERMAL | Status: DC
  Administered 2013-07-12: 1.5 mg via TRANSDERMAL

## 2013-07-12 MED ORDER — MEDROXYPROGESTERONE ACETATE 150 MG/ML IM SUSP: 150.0000 mg | INTRAMUSCULAR | Status: DC | PRN

## 2013-07-12 MED ORDER — SENNOSIDES-DOCUSATE SODIUM 8.6-50 MG PO TABS
2.0000 | ORAL_TABLET | ORAL | Status: DC
  Administered 2013-07-13 (×2): 2 via ORAL
  Filled 2013-07-12 (×2): qty 2

## 2013-07-12 MED ORDER — DIPHENHYDRAMINE HCL 50 MG/ML IJ SOLN: 12.5000 mg | INTRAMUSCULAR | Status: DC | PRN

## 2013-07-12 MED ORDER — SIMETHICONE 80 MG PO CHEW: 80.0000 mg | CHEWABLE_TABLET | ORAL | Status: DC | PRN

## 2013-07-12 MED ORDER — METOCLOPRAMIDE HCL 5 MG/ML IJ SOLN: 10.0000 mg | Freq: Three times a day (TID) | INTRAMUSCULAR | Status: DC | PRN

## 2013-07-12 MED ORDER — LACTATED RINGERS IV SOLN
INTRAVENOUS | Status: DC | PRN
  Administered 2013-07-12: 08:00:00 via INTRAVENOUS

## 2013-07-12 MED ORDER — NALBUPHINE HCL 10 MG/ML IJ SOLN
5.0000 mg | INTRAMUSCULAR | Status: DC | PRN
  Administered 2013-07-12: 10 mg via SUBCUTANEOUS

## 2013-07-12 MED ORDER — DEXTROSE IN LACTATED RINGERS 5 % IV SOLN: INTRAVENOUS | Status: DC

## 2013-07-12 MED ORDER — NALOXONE HCL 1 MG/ML IJ SOLN
1.0000 ug/kg/h | INTRAVENOUS | Status: DC | PRN
  Filled 2013-07-12: qty 2

## 2013-07-12 MED ORDER — SCOPOLAMINE 1 MG/3DAYS TD PT72: 1.0000 | MEDICATED_PATCH | Freq: Once | TRANSDERMAL | Status: DC

## 2013-07-12 MED ORDER — NALOXONE HCL 0.4 MG/ML IJ SOLN: 0.4000 mg | INTRAMUSCULAR | Status: DC | PRN

## 2013-07-12 MED ORDER — FENTANYL CITRATE 0.05 MG/ML IJ SOLN: 25.0000 ug | INTRAMUSCULAR | Status: DC | PRN

## 2013-07-12 MED ORDER — LANOLIN HYDROUS EX OINT: 1.0000 "application " | TOPICAL_OINTMENT | CUTANEOUS | Status: DC | PRN

## 2013-07-12 MED ORDER — ONDANSETRON HCL 4 MG PO TABS: 4.0000 mg | ORAL_TABLET | ORAL | Status: DC | PRN

## 2013-07-12 MED ORDER — IBUPROFEN 600 MG PO TABS
600.0000 mg | ORAL_TABLET | Freq: Four times a day (QID) | ORAL | Status: DC
  Administered 2013-07-12 – 2013-07-14 (×8): 600 mg via ORAL
  Filled 2013-07-12 (×8): qty 1

## 2013-07-12 MED ORDER — FENTANYL CITRATE 0.05 MG/ML IJ SOLN
INTRAMUSCULAR | Status: AC
  Filled 2013-07-12: qty 2

## 2013-07-12 MED ORDER — ONDANSETRON HCL 4 MG/2ML IJ SOLN
4.0000 mg | Freq: Three times a day (TID) | INTRAMUSCULAR | Status: DC | PRN
Start: 2013-07-12 — End: 2013-07-14

## 2013-07-12 MED ORDER — BUPIVACAINE IN DEXTROSE 0.75-8.25 % IT SOLN
INTRATHECAL | Status: DC | PRN
  Administered 2013-07-12: 1.4 mL via INTRATHECAL

## 2013-07-12 MED ORDER — KETOROLAC TROMETHAMINE 60 MG/2ML IM SOLN
60.0000 mg | Freq: Once | INTRAMUSCULAR | Status: AC | PRN
  Administered 2013-07-12: 60 mg via INTRAMUSCULAR

## 2013-07-12 MED ORDER — ONDANSETRON HCL 4 MG/2ML IJ SOLN
INTRAMUSCULAR | Status: AC
  Filled 2013-07-12: qty 2

## 2013-07-12 MED ORDER — TETANUS-DIPHTH-ACELL PERTUSSIS 5-2.5-18.5 LF-MCG/0.5 IM SUSP: 0.5000 mL | Freq: Once | INTRAMUSCULAR | Status: DC

## 2013-07-12 MED ORDER — MENTHOL 3 MG MT LOZG: 1.0000 | LOZENGE | OROMUCOSAL | Status: DC | PRN

## 2013-07-12 MED ORDER — OXYTOCIN 10 UNIT/ML IJ SOLN
40.0000 [IU] | INTRAVENOUS | Status: DC | PRN
  Administered 2013-07-12: 40 [IU] via INTRAVENOUS

## 2013-07-12 MED ORDER — DIPHENHYDRAMINE HCL 25 MG PO CAPS: 25.0000 mg | ORAL_CAPSULE | Freq: Four times a day (QID) | ORAL | Status: DC | PRN

## 2013-07-12 MED ORDER — MEPERIDINE HCL 25 MG/ML IJ SOLN: 6.2500 mg | INTRAMUSCULAR | Status: DC | PRN

## 2013-07-12 MED ORDER — NALBUPHINE HCL 10 MG/ML IJ SOLN: 5.0000 mg | INTRAMUSCULAR | Status: DC | PRN

## 2013-07-12 MED ORDER — SCOPOLAMINE 1 MG/3DAYS TD PT72
MEDICATED_PATCH | TRANSDERMAL | Status: AC
  Administered 2013-07-12: 1.5 mg via TRANSDERMAL
  Filled 2013-07-12: qty 1

## 2013-07-12 MED ORDER — DIBUCAINE 1 % RE OINT: 1.0000 "application " | TOPICAL_OINTMENT | RECTAL | Status: DC | PRN

## 2013-07-12 MED ORDER — NALBUPHINE HCL 10 MG/ML IJ SOLN
INTRAMUSCULAR | Status: AC
  Administered 2013-07-12: 10 mg via SUBCUTANEOUS
  Filled 2013-07-12: qty 1

## 2013-07-12 MED ORDER — MORPHINE SULFATE (PF) 0.5 MG/ML IJ SOLN
INTRAMUSCULAR | Status: DC | PRN
Start: 2013-07-12 — End: 2013-07-12
  Administered 2013-07-12: .15 mg via INTRATHECAL

## 2013-07-12 MED ORDER — SIMETHICONE 80 MG PO CHEW
80.0000 mg | CHEWABLE_TABLET | ORAL | Status: DC
  Administered 2013-07-13 (×2): 80 mg via ORAL
  Filled 2013-07-12 (×2): qty 1

## 2013-07-12 MED ORDER — PRENATAL MULTIVITAMIN CH
1.0000 | ORAL_TABLET | Freq: Every day | ORAL | Status: DC
  Administered 2013-07-13 – 2013-07-14 (×2): 1 via ORAL
  Filled 2013-07-12 (×2): qty 1

## 2013-07-12 MED ORDER — METOCLOPRAMIDE HCL 5 MG/ML IJ SOLN: 10.0000 mg | Freq: Once | INTRAMUSCULAR | Status: DC | PRN

## 2013-07-12 MED ORDER — OXYCODONE-ACETAMINOPHEN 5-325 MG PO TABS
1.0000 | ORAL_TABLET | ORAL | Status: DC | PRN
  Administered 2013-07-13 (×3): 2 via ORAL
  Administered 2013-07-13 (×2): 1 via ORAL
  Administered 2013-07-14 (×2): 2 via ORAL
  Filled 2013-07-12 (×3): qty 2
  Filled 2013-07-12 (×2): qty 1
  Filled 2013-07-12 (×2): qty 2

## 2013-07-12 MED ORDER — DIPHENHYDRAMINE HCL 50 MG/ML IJ SOLN: 25.0000 mg | INTRAMUSCULAR | Status: DC | PRN

## 2013-07-12 MED ORDER — MEASLES, MUMPS & RUBELLA VAC ~~LOC~~ INJ
0.5000 mL | INJECTION | Freq: Once | SUBCUTANEOUS | Status: DC
  Filled 2013-07-12: qty 0.5

## 2013-07-12 MED ORDER — ONDANSETRON HCL 4 MG/2ML IJ SOLN: 4.0000 mg | INTRAMUSCULAR | Status: DC | PRN

## 2013-07-12 MED ORDER — LACTATED RINGERS IV SOLN
INTRAVENOUS | Status: DC
  Administered 2013-07-12: 125 mL/h via INTRAVENOUS

## 2013-07-12 SURGICAL SUPPLY — 33 items
ADH SKN CLS APL DERMABOND .7 (GAUZE/BANDAGES/DRESSINGS) ×1
CLAMP CORD UMBIL (MISCELLANEOUS) ×2 IMPLANT
CLOTH BEACON ORANGE TIMEOUT ST (SAFETY) ×2 IMPLANT
DERMABOND ADVANCED (GAUZE/BANDAGES/DRESSINGS) ×1
DERMABOND ADVANCED .7 DNX12 (GAUZE/BANDAGES/DRESSINGS) ×1 IMPLANT
DRAPE LG THREE QUARTER DISP (DRAPES) IMPLANT
DRSG OPSITE POSTOP 4X10 (GAUZE/BANDAGES/DRESSINGS) ×2 IMPLANT
DURAPREP 26ML APPLICATOR (WOUND CARE) ×2 IMPLANT
ELECT REM PT RETURN 9FT ADLT (ELECTROSURGICAL) ×2
ELECTRODE REM PT RTRN 9FT ADLT (ELECTROSURGICAL) ×1 IMPLANT
EXTRACTOR VACUUM M CUP 4 TUBE (SUCTIONS) IMPLANT
GLOVE BIO SURGEON STRL SZ 6.5 (GLOVE) ×4 IMPLANT
GLOVE BIOGEL PI IND STRL 7.0 (GLOVE) ×1 IMPLANT
GLOVE BIOGEL PI INDICATOR 7.0 (GLOVE) ×1
GOWN STRL REUS W/TWL LRG LVL3 (GOWN DISPOSABLE) ×5 IMPLANT
KIT ABG SYR 3ML LUER SLIP (SYRINGE) IMPLANT
NEEDLE HYPO 25X5/8 SAFETYGLIDE (NEEDLE) IMPLANT
NS IRRIG 1000ML POUR BTL (IV SOLUTION) ×2 IMPLANT
PACK C SECTION WH (CUSTOM PROCEDURE TRAY) ×2 IMPLANT
PAD ABD 7.5X8 STRL (GAUZE/BANDAGES/DRESSINGS) ×2 IMPLANT
PAD OB MATERNITY 4.3X12.25 (PERSONAL CARE ITEMS) ×2 IMPLANT
SPONGE GAUZE 4X4 12PLY STER LF (GAUZE/BANDAGES/DRESSINGS) ×1 IMPLANT
STAPLER VISISTAT 35W (STAPLE) IMPLANT
SUT CHROMIC 0 CT 802H (SUTURE) IMPLANT
SUT CHROMIC 0 CTX 36 (SUTURE) ×7 IMPLANT
SUT MON AB-0 CT1 36 (SUTURE) ×2 IMPLANT
SUT PDS AB 0 CTX 60 (SUTURE) ×2 IMPLANT
SUT PLAIN 0 NONE (SUTURE) IMPLANT
SUT VIC AB 4-0 KS 27 (SUTURE) ×1 IMPLANT
TAPE CLOTH SURG 4X10 WHT LF (GAUZE/BANDAGES/DRESSINGS) ×1 IMPLANT
TOWEL OR 17X24 6PK STRL BLUE (TOWEL DISPOSABLE) ×2 IMPLANT
TRAY FOLEY CATH 14FR (SET/KITS/TRAYS/PACK) IMPLANT
WATER STERILE IRR 1000ML POUR (IV SOLUTION) ×1 IMPLANT

## 2013-07-12 NOTE — Anesthesia Procedure Notes (Signed)
Spinal  Patient location during procedure: OR Start time: 07/12/2013 7:33 AM Staffing Anesthesiologist: Brayton Caves Performed by: anesthesiologist  Preanesthetic Checklist Completed: patient identified, site marked, surgical consent, pre-op evaluation, timeout performed, IV checked, risks and benefits discussed and monitors and equipment checked Spinal Block Patient position: sitting Prep: DuraPrep Patient monitoring: heart rate, cardiac monitor, continuous pulse ox and blood pressure Approach: midline Location: L3-4 Injection technique: single-shot Needle Needle type: Sprotte  Needle gauge: 24 G Needle length: 9 cm Assessment Sensory level: T4 Additional Notes Patient identified.  Risk benefits discussed including failed block, incomplete pain control, headache, nerve damage, paralysis, blood pressure changes, nausea, vomiting, reactions to medication both toxic or allergic, and postpartum back pain.  Patient expressed understanding and wished to proceed.  All questions were answered.  Sterile technique used throughout procedure.  CSF was clear.  No parasthesia or other complications.  Please see nursing notes for vital signs.

## 2013-07-12 NOTE — Anesthesia Preprocedure Evaluation (Addendum)
Anesthesia Evaluation  Patient identified by MRN, date of birth, ID band Patient awake    Reviewed: Allergy & Precautions, H&P , NPO status , Patient's Chart, lab work & pertinent test results, reviewed documented beta blocker date and time   History of Anesthesia Complications Negative for: history of anesthetic complications  Airway Mallampati: II TM Distance: >3 FB Neck ROM: full    Dental  (+) Teeth Intact   Pulmonary neg pulmonary ROS,  breath sounds clear to auscultation        Cardiovascular negative cardio ROS  Rhythm:regular Rate:Normal     Neuro/Psych negative neurological ROS  negative psych ROS   GI/Hepatic negative GI ROS, Neg liver ROS,   Endo/Other  Morbid obesity  Renal/GU negative Renal ROS  negative genitourinary   Musculoskeletal   Abdominal   Peds  Hematology negative hematology ROS (+)   Anesthesia Other Findings   Reproductive/Obstetrics (+) Pregnancy (h/o c/s x1, for repeat)                           Anesthesia Physical Anesthesia Plan  ASA: III  Anesthesia Plan: Spinal   Post-op Pain Management:    Induction:   Airway Management Planned:   Additional Equipment:   Intra-op Plan:   Post-operative Plan:   Informed Consent: I have reviewed the patients History and Physical, chart, labs and discussed the procedure including the risks, benefits and alternatives for the proposed anesthesia with the patient or authorized representative who has indicated his/her understanding and acceptance.     Plan Discussed with: Surgeon and CRNA  Anesthesia Plan Comments:        Anesthesia Quick Evaluation

## 2013-07-12 NOTE — Lactation Note (Signed)
This note was copied from the chart of Karen [facility]. Lactation Consultation Note  Patient Name: Karen Tapia [id number] Date: 07/12/2013 Reason for consult: Follow-up assessment Mom reports that baby suckling so aggressively that right nipple bleeding slightly. Mom decided to pump every 3-4 hours tonight for 15 minutes on preemie setting, and start pumping every 2-3 hours for 15 minutes in the morning. Set mom up with DEBP with instructions. Enc mom to call out for assistance feeding the baby EBM. Left spoons at bedside and discussed assessment and plan with patient's [facility] RN. Mom given comfort gels with instructions, and mom enc to rub EBM on nipples for healing.   Maternal Data Has patient been taught Hand Expression?: Yes Does the patient have breastfeeding experience prior to this delivery?: Yes  Feeding Feeding Type: Breast Fed Length of feed:  (LC assessed first 20 minutes of Breastfeed.)  LATCH Score/Interventions Latch: Grasps breast easily, tongue down, lips flanged, rhythmical sucking. (Initially very fussy until fitted with a NS.)  Audible Swallowing: A few with stimulation  Type of Nipple: Flat  Comfort (Breast/Nipple): Filling, red/small blisters or bruises, mild/mod discomfort (Right nipple bleeding.)  Problem noted: Mild/Moderate discomfort  Hold (Positioning): Assistance needed to correctly position infant at breast and maintain latch. Intervention(s): Breastfeeding basics reviewed;Support Pillows;Position options;Skin to skin  LATCH Score: 6  Lactation Tools Discussed/Used Tools: Nipple Shields Nipple shield size: 20   Consult Status Consult Status: Follow-up Follow-up type: In-patient    Sherlyn Hay 07/12/2013, 11:03 PM

## 2013-07-12 NOTE — Addendum Note (Signed)
Addendum created 07/12/13 0920 by Jhonnie Garner, CRNA   Modules edited: Anesthesia Events

## 2013-07-12 NOTE — Lactation Note (Signed)
This note was copied from the chart of Karen [facility]. Lactation Consultation Note  Patient Name: Karen Tapia [id number] Date: 07/12/2013 Reason for consult: Initial assessment;Difficult latch Baby 14 hours of life. Mom reports baby has a strong suck and is frustrated. Mom attempted to nurse first child, but baby "never really could latch well." Mom was supplementing with formula with first child and just didn't have enough milk come in for the baby. Both of mom's breasts are already bruised. Mom is able to return demonstrate hand expression, colostrum noted. Baby has a very strong suck. Mom's nipples are flat, so fitted with a #20 NS. Mom able to return demonstrate applying NS herself. LS assessed first 20 minutes of nursing on right breast in the football hold. Baby aggressively suckled and colostrum noted in the NS. Mom reports comfort with NS. Enc mom to call out for assistance as needed, and to let baby nurse on left side if still cueing after nursing on right side. Enc mom to feed with cues, and to feed baby 8-12 times per 24 hours, or more often if baby cues. Mom given [facility] brochure, aware of OP/BFSG and community resources.   Maternal Data Has patient been taught Hand Expression?: Yes Does the patient have breastfeeding experience prior to this delivery?: Yes  Feeding Feeding Type: Breast Fed Length of feed:  (LC assessed first 20 minutes of Breastfeed.)  LATCH Score/Interventions Latch: Grasps breast easily, tongue down, lips flanged, rhythmical sucking. (Initially very fussy until fitted with a NS.)  Audible Swallowing: A few with stimulation  Type of Nipple: Flat  Comfort (Breast/Nipple): Filling, red/small blisters or bruises, mild/mod discomfort  Problem noted: Mild/Moderate discomfort  Hold (Positioning): Assistance needed to correctly position infant at breast and maintain latch. Intervention(s): Breastfeeding basics reviewed;Support Pillows;Position options;Skin to  skin  LATCH Score: 6  Lactation Tools Discussed/Used Tools: Nipple Shields Nipple shield size: 20   Consult Status Consult Status: Follow-up Follow-up type: In-patient    Sherlyn Hay 07/12/2013, 10:26 PM

## 2013-07-12 NOTE — Op Note (Signed)
Cesarean Section Procedure Note   Karen Tapia  07/12/2013  Indications: Scheduled Proceedure/Maternal Request   Pre-operative Diagnosis: previous cesarean section times one.   Post-operative Diagnosis: Same   Surgeon: Surgeon(s) and Role:    * Zelphia Cairo, MD - Primary   Assistants: Delorise Jackson, scrub tech  Anesthesia: spinal   Procedure Details:  The patient was seen in the Holding Room. The risks, benefits, complications, treatment options, and expected outcomes were discussed with the patient. The patient concurred with the proposed plan, giving informed consent. identified as Karen Tapia and the procedure verified as C-Section Delivery. A Time Out was held and the above information confirmed.  After induction of anesthesia, the patient was draped and prepped in the usual sterile manner. A transverse was made and carried down through the subcutaneous tissue to the fascia. Fascial incision was made and extended transversely. The fascia was separated from the underlying rectus tissue superiorly and inferiorly. The peritoneum was identified and entered. Peritoneal incision was extended longitudinally. The utero-vesical peritoneal reflection was incised transversely and the bladder flap was bluntly freed from the lower uterine segment. A low transverse uterine incision was made. Delivered from cephalic presentation was a vigerous female with Apgar scores of 9 at one minute and 9 at five minutes. Cord ph was not sent the umbilical cord was clamped and cut cord blood was obtained for evaluation. The placenta was removed Intact and appeared normal. The uterine outline, tubes and ovaries appeared normal}. The uterine incision was closed with running locked sutures of 0chromic gut.   Hemostasis was observed. Lavage was carried out until clear. The fascia was then reapproximated with running sutures of 0PDS. The skin was closed with 4-0Vicryl.   Instrument, sponge, and needle counts were correct prior  the abdominal closure and were correct at the conclusion of the case.     Estimated Blood Loss:800cc  Urine Output: clear  Specimens: placenta to path - pt w/ h/o melanoma  Complications: no complications  Disposition: PACU - hemodynamically stable.   Maternal Condition: stable   Baby condition / location:  Couplet care / Skin to Skin  Attending Attestation: I was present and scrubbed for the entire procedure.   Signed: Surgeon(s): Zelphia Cairo, MD

## 2013-07-12 NOTE — Anesthesia Postprocedure Evaluation (Signed)
Anesthesia Post Note  Patient: Karen Tapia  Procedure(s) Performed: Procedure(s) (LRB): CESAREAN SECTION (N/A)  Anesthesia type: Spinal  Patient location: PACU  Post pain: Pain level controlled  Post assessment: Post-op Vital signs reviewed  Last Vitals: There were no vitals filed for this visit.  Post vital signs: Reviewed  Level of consciousness: awake  Complications: No apparent anesthesia complications

## 2013-07-12 NOTE — Transfer of Care (Signed)
Immediate Anesthesia Transfer of Care Note  Patient: Karen Tapia  Procedure(s) Performed: Procedure(s) with comments: CESAREAN SECTION (N/A) - repeat  edc 07/18/13  Patient Location: PACU  Anesthesia Type:Spinal  Level of Consciousness: awake, alert , oriented and patient cooperative  Airway & Oxygen Therapy: Patient Spontanous Breathing  Post-op Assessment: Report given to PACU RN and Post -op Vital signs reviewed and stable  Post vital signs: stable  Complications: No apparent anesthesia complications

## 2013-07-12 NOTE — Anesthesia Postprocedure Evaluation (Signed)
  Anesthesia Post-op Note  Patient: Karen Tapia  Procedure(s) Performed: Procedure(s) with comments: CESAREAN SECTION (N/A) - repeat  edc 07/18/13  Patient Location: Mother/Baby  Anesthesia Type:Spinal  Level of Consciousness: awake, alert  and oriented  Airway and Oxygen Therapy: Patient Spontanous Breathing  Post-op Pain: none  Post-op Assessment: Post-op Vital signs reviewed, Patient's Cardiovascular Status Stable, Respiratory Function Stable, No headache, No backache, No residual numbness and No residual motor weakness  Post-op Vital Signs: Reviewed and stable  Last Vitals:  Filed Vitals:   07/12/13 1427  BP: 139/87  Pulse: 80  Temp:   Resp: 18    Complications: No apparent anesthesia complications

## 2013-07-12 NOTE — Addendum Note (Signed)
Addendum created 07/12/13 1645 by Shanon Payor, CRNA   Modules edited: Notes Section   Notes Section:  File: [id number]

## 2013-07-13 LAB — CBC
HCT: 28.2 % — ABNORMAL LOW (ref 36.0–46.0)
Hemoglobin: 9.2 g/dL — ABNORMAL LOW (ref 12.0–15.0)
MCH: 31.5 pg (ref 26.0–34.0)
MCHC: 32.6 g/dL (ref 30.0–36.0)
MCV: 96.6 fL (ref 78.0–100.0)
PLATELETS: 157 10*3/uL (ref 150–400)
RBC: 2.92 MIL/uL — ABNORMAL LOW (ref 3.87–5.11)
RDW: 15.1 % (ref 11.5–15.5)
WBC: 9.6 10*3/uL (ref 4.0–10.5)

## 2013-07-13 NOTE — Lactation Note (Signed)
This note was copied from the chart of Karen [facility]. Lactation Consultation Note  Patient Name: Karen Tapia [id number] Date: 07/13/2013 Reason for consult: Follow-up assessment;Breast/nipple pain;Difficult latch Mom had baby latched to left breast when I arrived. Assisted Mom with obtaining more depth with using the nipple shield, baby demonstrates a good rhythmic suck, few swallows noted, however scant amount of colostrum in the nipple shield and after few minutes, baby will pull off and become fussy at the breast. LC assisted Mom with latching baby to right breast, no colostrum visible after nursing on the right breast for approx 5 minutes. Mom has not pumped consistently today. Encouraged Mom to post pump after feeding to encourage milk production and give baby back any amount of EBM she receives using curved tipped syringe with finger feeding. Baby very fussy after coming off the breast so parents wanted to supplement with formula this feeding to satisfy baby and Mom will post pump. FOB gave baby 10 ml of Enfamil using curved tipped syringe with finger feeding. Reviewed supplementing guidelines with parents and advised could continue to finger feed tonight or use bottle with slow flow nipple. Plan is Mom will BF each feeding, post pump for 15 minutes and parents with supplement with either EBM or formula till Mom is observing colostrum in the nipple shield with feedings and baby is more satisfied at the breast. Advised to ask for assist as needed.   Maternal Data    Feeding Feeding Type: Formula Length of feed: 15 min  LATCH Score/Interventions Latch: Grasps breast easily, tongue down, lips flanged, rhythmical sucking.  Audible Swallowing: A few with stimulation  Type of Nipple: Everted at rest and after stimulation  Comfort (Breast/Nipple): Filling, red/small blisters or bruises, mild/mod discomfort  Problem noted: Cracked, bleeding, blisters, bruises;Mild/Moderate discomfort  (bruising left aerola, bil nipples sore) Interventions  (Cracked/bleeding/bruising/blister): Double electric pump;Expressed breast milk to nipple Interventions (Mild/moderate discomfort): Post-pump;Comfort gels  Hold (Positioning): Assistance needed to correctly position infant at breast and maintain latch. Intervention(s): Breastfeeding basics reviewed;Support Pillows;Position options;Skin to skin  LATCH Score: 7  Lactation Tools Discussed/Used Tools: Pump;Nipple Shields;Comfort gels Nipple shield size: 20 Breast pump type: Double-Electric Breast Pump   Consult Status Consult Status: Follow-up Date: 07/14/13 Follow-up type: In-patient    Karen Tapia Karen Tapia 07/13/2013, 11:24 PM

## 2013-07-13 NOTE — Progress Notes (Signed)
Subjective: Postpartum Day one: Cesarean Delivery Patient reports tolerating PO and no problems voiding.    Objective: Vital signs in last 24 hours: Temp:  [98.1 F (36.7 C)-98.7 F (37.1 C)] 98.7 F (37.1 C) (05/30 0440) Pulse Rate:  [68-94] 85 (05/30 0440) Resp:  [17-22] 18 (05/30 0440) BP: (96-139)/(56-87) 113/73 mmHg (05/30 0440) SpO2:  [93 %-100 %] 96 % (05/30 0440) Weight:  [121.564 kg (268 lb)] 121.564 kg (268 lb) (05/29 0915)  Physical Exam:  General: alert Lochia: appropriate Uterine Fundus: firm Incision: healing well DVT Evaluation: No evidence of DVT seen on physical exam.   Recent Labs  07/11/13 0958 07/13/13 0541  HGB 12.1 9.2*  HCT 36.1 28.2*    Assessment/Plan: Status post Cesarean section. Doing well postoperatively.  Continue current care.  Raphael Gibney Holland Kotter 07/13/2013, 8:54 AM

## 2013-07-14 MED ORDER — OXYCODONE-ACETAMINOPHEN 7.5-325 MG PO TABS: 1.0000 | ORAL_TABLET | ORAL | Status: DC | PRN

## 2013-07-14 NOTE — Discharge Summary (Signed)
Obstetric Discharge Summary Reason for Admission: cesarean section Prenatal Procedures: none and amniocentesis Intrapartum Procedures: cesarean: low cervical, transverse Postpartum Procedures: none Complications-Operative and Postpartum: none Hemoglobin  Date Value Ref Range Status  07/13/2013 9.2* 12.0 - 15.0 g/dL Final     REPEATED TO VERIFY     DELTA CHECK NOTED     HCT  Date Value Ref Range Status  07/13/2013 28.2* 36.0 - 46.0 % Final    Physical Exam:  General: alert Lochia: appropriate Uterine Fundus: firm Incision: healing well DVT Evaluation: No evidence of DVT seen on physical exam.  Discharge Diagnoses: Term Pregnancy-delivered  Discharge Information: Date: 07/14/2013 Activity: pelvic rest Diet: routine Medications: PNV and Percocet Condition: stable Instructions: refer to practice specific booklet Discharge to: home   Newborn Data: Live born female  Birth Weight: 9 lb 1.5 oz (4125 g) APGAR: 9, 9  Home with mother.  Raphael Gibney Iley Breeden 07/14/2013, 8:07 AM

## 2013-07-15 ENCOUNTER — Encounter (HOSPITAL_COMMUNITY): Payer: Self-pay | Admitting: Obstetrics and Gynecology

## 2013-12-16 ENCOUNTER — Encounter (HOSPITAL_COMMUNITY): Payer: Self-pay | Admitting: Obstetrics and Gynecology

## 2014-03-08 ENCOUNTER — Ambulatory Visit: Payer: Self-pay | Admitting: Podiatry

## 2014-03-18 ENCOUNTER — Ambulatory Visit (INDEPENDENT_AMBULATORY_CARE_PROVIDER_SITE_OTHER): Payer: Managed Care, Other (non HMO) | Admitting: Podiatry

## 2014-03-18 ENCOUNTER — Ambulatory Visit (INDEPENDENT_AMBULATORY_CARE_PROVIDER_SITE_OTHER): Payer: Managed Care, Other (non HMO)

## 2014-03-18 ENCOUNTER — Encounter: Payer: Self-pay | Admitting: Podiatry

## 2014-03-18 VITALS — BP 154/94 | HR 68 | Resp 16

## 2014-03-18 DIAGNOSIS — R52 Pain, unspecified: Secondary | ICD-10-CM

## 2014-03-18 DIAGNOSIS — M722 Plantar fascial fibromatosis: Secondary | ICD-10-CM

## 2014-03-18 MED ORDER — TRIAMCINOLONE ACETONIDE 10 MG/ML IJ SUSP
10.0000 mg | Freq: Once | INTRAMUSCULAR | Status: AC
  Administered 2014-03-18: 10 mg

## 2014-03-18 MED ORDER — MELOXICAM 15 MG PO TABS: 15.0000 mg | ORAL_TABLET | Freq: Every day | ORAL | Status: DC

## 2014-03-18 NOTE — Progress Notes (Signed)
   Subjective:    Patient ID: Karen Tapia, female    DOB: [birth year], 34 y.o.   MRN: [id number]  HPI Pt presents with left heel pain, worsening over several worse when getting up after resting   Review of Systems  All other systems reviewed and are negative.      Objective:   Physical Exam        Assessment & Plan:

## 2014-03-18 NOTE — Progress Notes (Signed)
Subjective:     Patient ID: Karen Tapia, female   DOB: 1980/03/02, 34 y.o.   MRN: [id number]  HPI patient presents with heel pain left it's been getting worse over the last month. She is still with it on and off for the last 6 months and is having more discomfort over the last month and cannot exercise or be active and does have a 447-month-old baby   Review of Systems  All other systems reviewed and are negative.      Objective:   Physical Exam  Constitutional: She is oriented to person, place, and time.  Cardiovascular: Intact distal pulses.   Musculoskeletal: Normal range of motion.  Neurological: She is oriented to person, place, and time.  Skin: Skin is warm.  Nursing note and vitals reviewed.  neurovascular status found to be intact with muscle strength adequate and range of motion subtalar and midtarsal joint within normal limits. Patient's noted to have severe discomfort left plantar fashion at the insertion of the tendon into the calcaneus with inflammation and fluid around the medial band and is noted to have good digital perfusion and moderate depression of the arch     Assessment:     Acute plantar fasciitis left with inflammation and fluid around the medial band    Plan:     H&P and x-ray reviewed. Injected the medial tendon 3 mg Kenalog 5 g I can Marcaine mixture at its insertion and applied fascial brace with instructions on usage. Placed on mobility and gave instructions on physical therapy

## 2014-03-18 NOTE — Patient Instructions (Signed)

## 2014-03-31 ENCOUNTER — Encounter: Payer: Self-pay | Admitting: Podiatry

## 2014-03-31 ENCOUNTER — Ambulatory Visit (INDEPENDENT_AMBULATORY_CARE_PROVIDER_SITE_OTHER): Payer: Managed Care, Other (non HMO) | Admitting: Podiatry

## 2014-03-31 VITALS — BP 122/70 | HR 67 | Resp 16

## 2014-03-31 DIAGNOSIS — M722 Plantar fascial fibromatosis: Secondary | ICD-10-CM

## 2014-04-01 NOTE — Progress Notes (Signed)
Subjective:     Patient ID: Karen Tapia, female   DOB: [birth year], 34 y.o.   MRN: [id number]  HPI patient states my left heel is feeling quite a bit better and I'm able to walk distances without pain   Review of Systems     Objective:   Physical Exam Neurovascular status intact with significant diminishment of discomfort left plantar heel at the insertion of the tendon into the calcaneus    Assessment:     Improved plantar fasciitis left with diminished fluid buildup noted    Plan:     Advised on physical therapy anti-inflammatories and continued orthotic usage. Reappoint as needed

## 2014-04-10 ENCOUNTER — Other Ambulatory Visit: Payer: Self-pay | Admitting: Dermatology

## 2014-07-02 ENCOUNTER — Encounter: Payer: Self-pay | Admitting: Podiatry

## 2014-07-02 ENCOUNTER — Ambulatory Visit (INDEPENDENT_AMBULATORY_CARE_PROVIDER_SITE_OTHER): Payer: Managed Care, Other (non HMO) | Admitting: Podiatry

## 2014-07-02 VITALS — BP 137/82 | HR 77 | Resp 15

## 2014-07-02 DIAGNOSIS — M722 Plantar fascial fibromatosis: Secondary | ICD-10-CM

## 2014-07-02 MED ORDER — TRIAMCINOLONE ACETONIDE 10 MG/ML IJ SUSP
10.0000 mg | Freq: Once | INTRAMUSCULAR | Status: AC
  Administered 2014-07-02: 10 mg

## 2014-07-03 NOTE — Progress Notes (Signed)
Subjective:     Patient ID: Karen Tapia, female   DOB: 1980-04-26, 34 y.o.   MRN: [id number]  HPI patient presents stating my left heel is quite sore in the medial band and it got better for a month or 2 but every time I try to exercise it starts up again   Review of Systems     Objective:   Physical Exam Neurovascular status intact muscle strength adequate with exquisite discomfort plantar heel left at the insertional point of the tendon the calcaneus with inflammation and fluid buildup noted. Patient does have depression of the arch noted bilateral    Assessment:     Plantar fasciitis of an acute nature left which continues to reoccur a number of times despite conservative care    Plan:     I've recommended a increase in the care we provide for this and went ahead today and I reinjected the plantar fascia 3 Milligan Kenalog 5 mill grams Xylocaine Marcaine mixture and instructed this patient on custom orthotics and patient was scanned today be seen back when those are returned

## 2014-07-25 ENCOUNTER — Ambulatory Visit: Payer: Managed Care, Other (non HMO) | Admitting: *Deleted

## 2014-07-25 DIAGNOSIS — M722 Plantar fascial fibromatosis: Secondary | ICD-10-CM

## 2014-07-25 NOTE — Progress Notes (Signed)
Patient ID: Karen Tapia, female   DOB: [birth year], 35 y.o.   MRN: [id number] PICKING UP INSERTS

## 2014-07-25 NOTE — Patient Instructions (Signed)

## 2014-09-19 ENCOUNTER — Other Ambulatory Visit: Payer: Self-pay | Admitting: Obstetrics and Gynecology

## 2014-09-22 LAB — CYTOLOGY - PAP

## 2015-05-27 ENCOUNTER — Ambulatory Visit (INDEPENDENT_AMBULATORY_CARE_PROVIDER_SITE_OTHER): Payer: Managed Care, Other (non HMO) | Admitting: Podiatry

## 2015-05-27 ENCOUNTER — Encounter: Payer: Self-pay | Admitting: Podiatry

## 2015-05-27 ENCOUNTER — Ambulatory Visit (INDEPENDENT_AMBULATORY_CARE_PROVIDER_SITE_OTHER): Payer: Managed Care, Other (non HMO)

## 2015-05-27 DIAGNOSIS — M79671 Pain in right foot: Secondary | ICD-10-CM | POA: Diagnosis not present

## 2015-05-27 DIAGNOSIS — M722 Plantar fascial fibromatosis: Secondary | ICD-10-CM | POA: Diagnosis not present

## 2015-05-27 MED ORDER — TRIAMCINOLONE ACETONIDE 10 MG/ML IJ SUSP
10.0000 mg | Freq: Once | INTRAMUSCULAR | Status: AC
  Administered 2015-05-27: 10 mg

## 2015-05-27 MED ORDER — MELOXICAM 15 MG PO TABS: 15.0000 mg | ORAL_TABLET | Freq: Every day | ORAL | Status: AC

## 2015-05-27 NOTE — Patient Instructions (Signed)

## 2015-05-28 NOTE — Progress Notes (Signed)
Subjective:     Patient ID: Karen Tapia, female   DOB: 1980-07-24, 35 y.o.   MRN: [id number]  HPI patient states my right heel has started to hurt over the last 6 weeks and it's just like the left one   Review of Systems     Objective:   Physical Exam Neurovascular status intact with patient who is trying to get more active and has exquisite discomfort right plantar fascia at the insertion into the calcaneus    Assessment:     Plantar fasciitis right with inflammatory changes    Plan:     Injected the right plantar fascia 3 mg Kenalog 5 mg Xylocaine dispensed fascial brace and gave instructions on supportive shoes and placed on diclofenac 75 mg twice a day. Reappoint 2 weeks

## 2015-06-10 ENCOUNTER — Encounter: Payer: Self-pay | Admitting: Podiatry

## 2015-06-10 ENCOUNTER — Ambulatory Visit (INDEPENDENT_AMBULATORY_CARE_PROVIDER_SITE_OTHER): Payer: Managed Care, Other (non HMO) | Admitting: Podiatry

## 2015-06-10 DIAGNOSIS — M722 Plantar fascial fibromatosis: Secondary | ICD-10-CM

## 2015-06-10 NOTE — Progress Notes (Signed)
Subjective:     Patient ID: Karen Tapia, female   DOB: [birth year], 35 y.o.   MRN: [id number]  HPI patient states I'm feeling a lot better with diminishment of discomfort and able to walk without pain   Review of Systems     Objective:   Physical Exam Neurovascular status intact with minimal discomfort upon palpation to the plantar heel right    Assessment:     Improved plantar fasciitis right    Plan:     Advised on continuation of mold the continuation of physical therapy anti-inflammatory shoe gear modifications and reappoint as needed

## 2015-10-31 IMAGING — US US OB FOLLOW-UP
1 series · 12 of 28 positions shown · non-contrast
Comparison: none

[Series 1: us ob follow-up · 0.23mm/px · 12 of 36 slices shown]
[im 2/36]
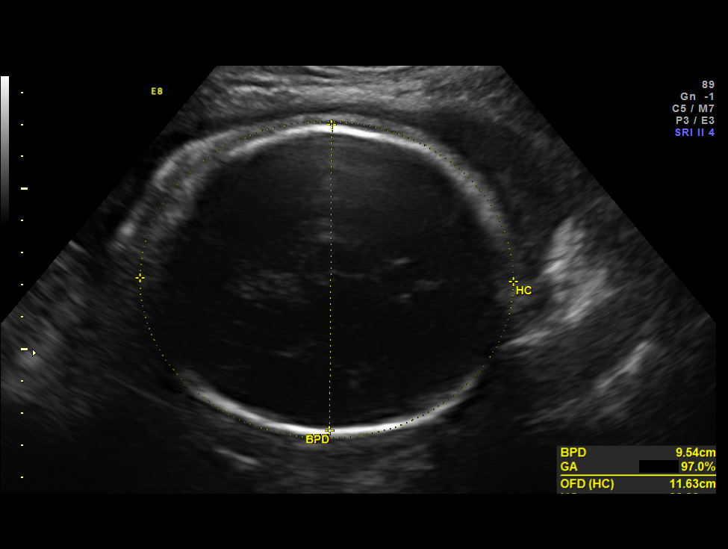
[im 4/36]
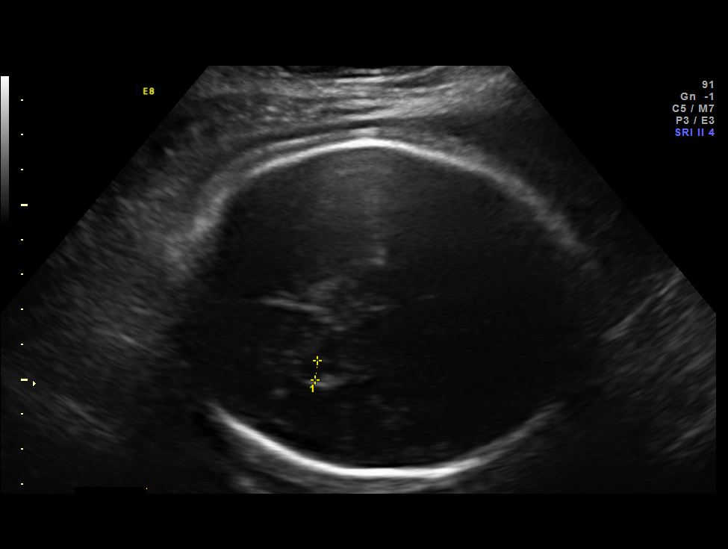
[im 7/36]
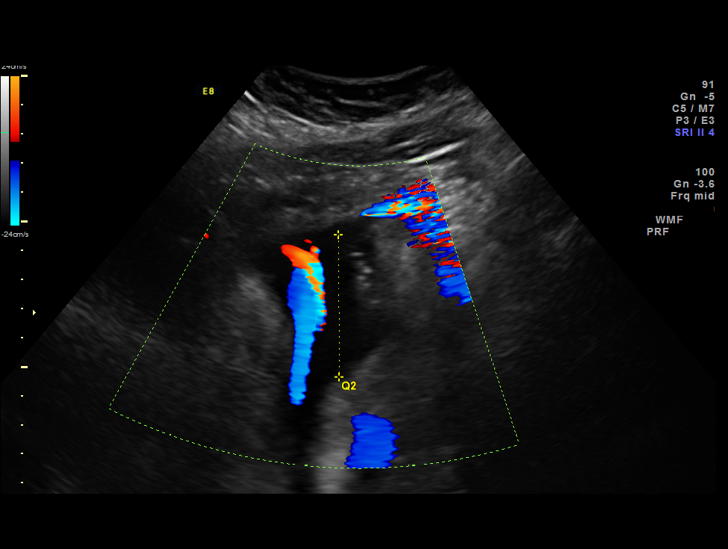
[im 11/36]
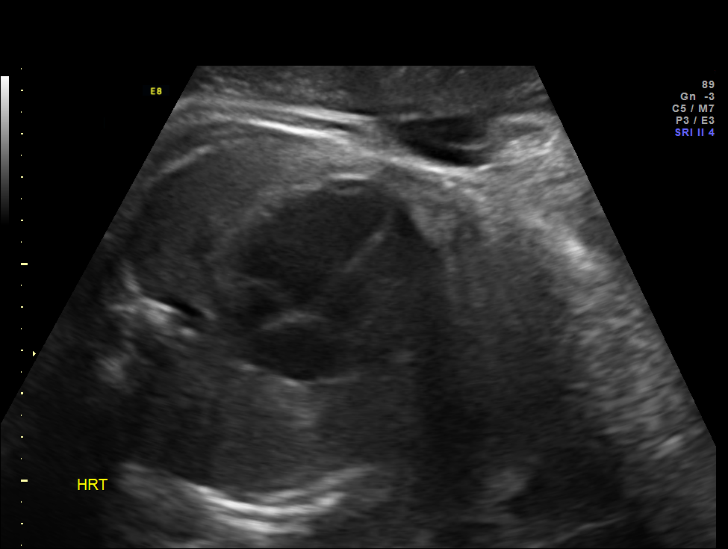
[im 13/36]
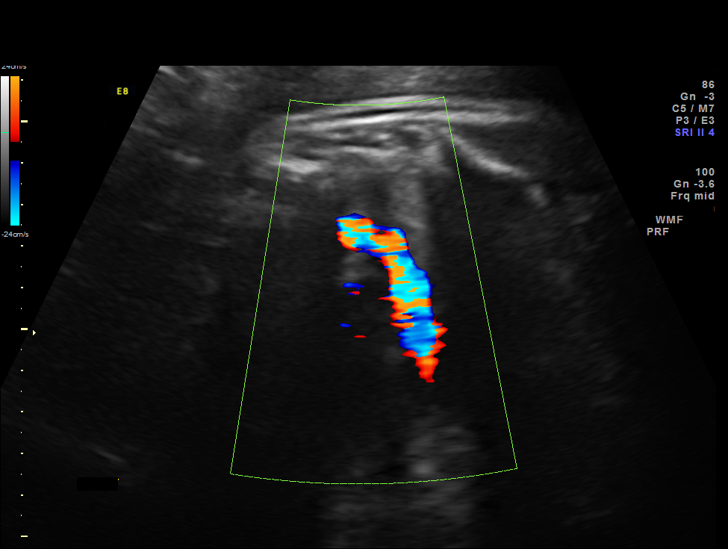
[im 16/36]
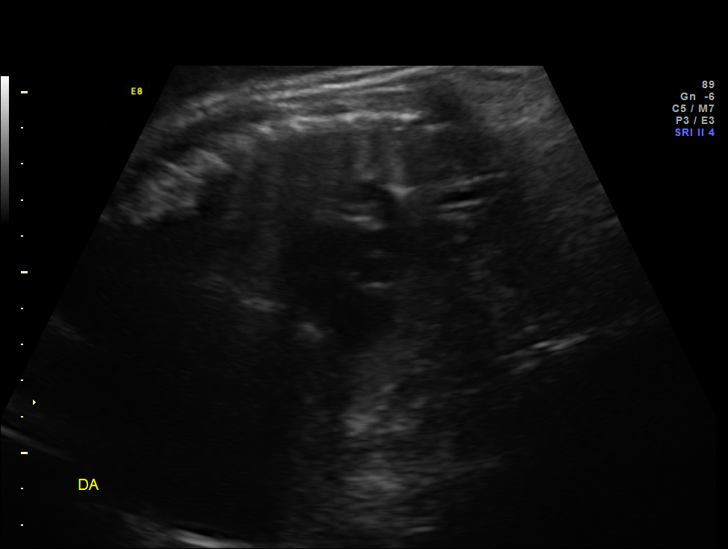
[im 20/36]
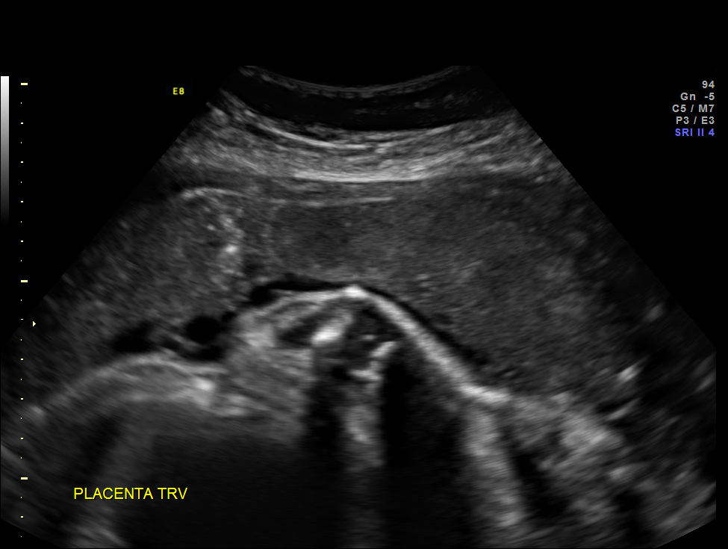
[im 23/36]
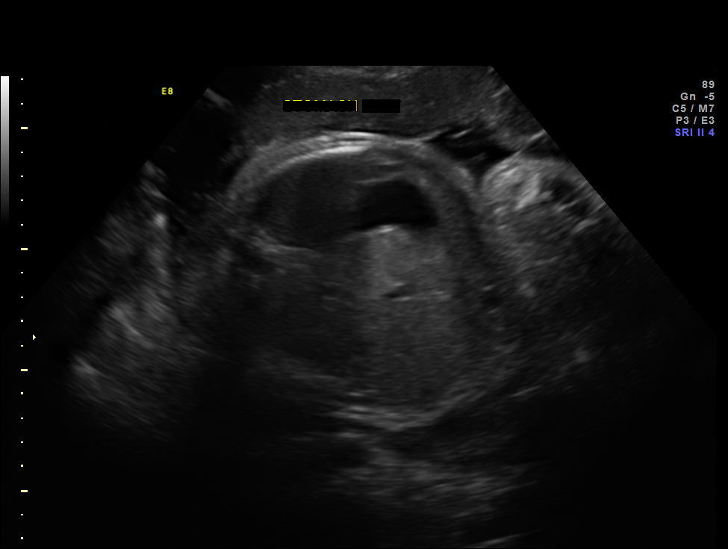
[im 25/36]
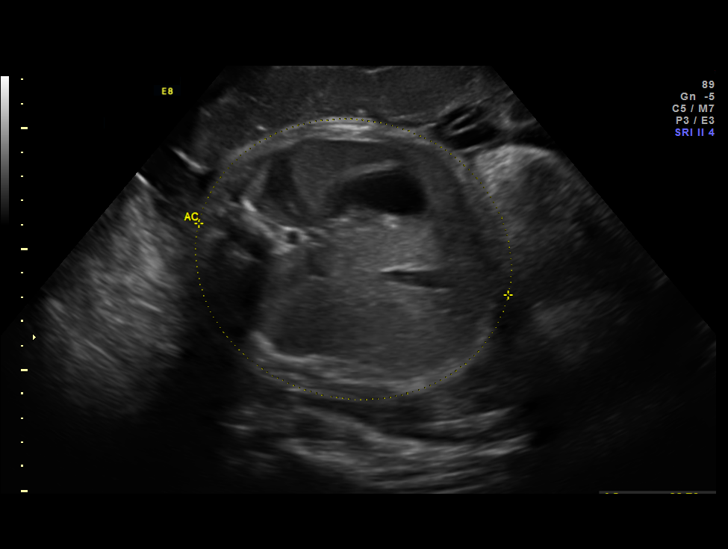
[im 29/36]
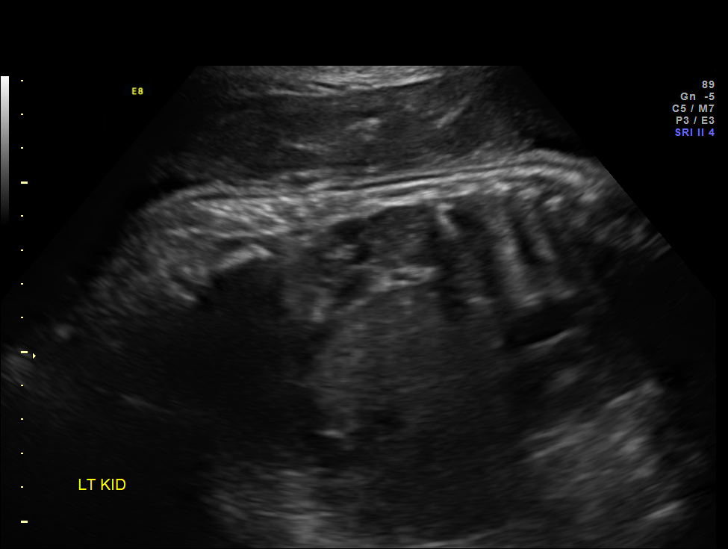
[im 32/36]
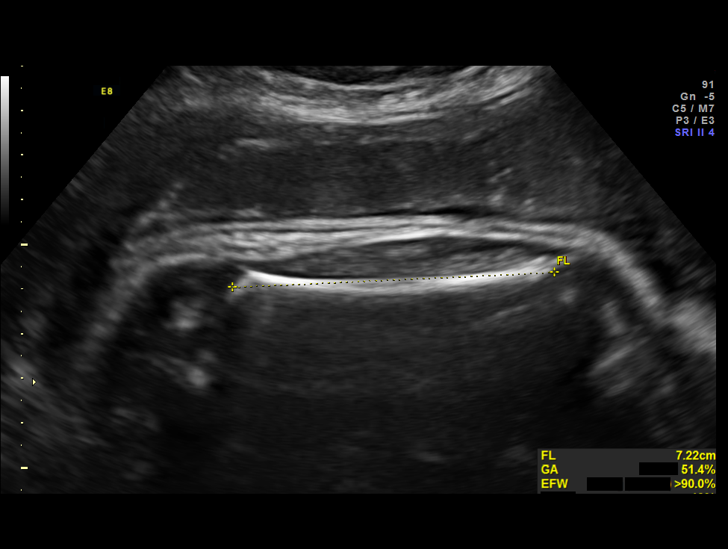
[im 34/36]
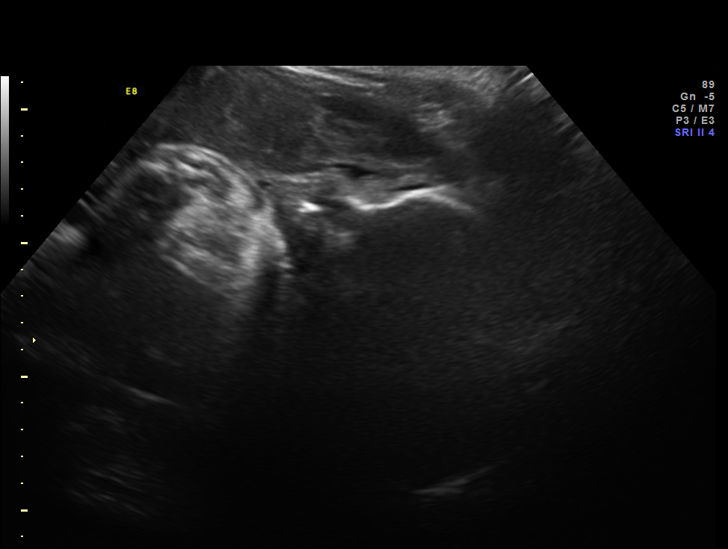

[12 of 28 positions shown; findings below may reference images not displayed]

OBSTETRICS REPORT
                      (Signed Final 06/26/2013 [DATE])

Service(s) Provided

 US OB FOLLOW UP                                       76816.1
Indications

 Polyhydramnios
 Advanced maternal age (AMA), Multigravida - low
 risk NIPS
 Previous cesarean section
 Size greater than dates (Large for gestational [AGE]
Fetal Evaluation

 Num Of Fetuses:    1
 Fetal Heart Rate:  159                          bpm
 Cardiac Activity:  Observed
 Presentation:      Cephalic
 Placenta:          Anterior, above cervical os
 P. Cord            Previously Visualized
 Insertion:

 Amniotic Fluid
 AFI FV:      Subjectively within normal limits
 AFI Sum:     17.06   cm       64  %Tile     Larg Pckt:    6.38  cm
 RUQ:   4.18    cm   RLQ:    6.38   cm    LUQ:   4.89    cm   LLQ:    1.61   cm
Biometry

 BPD:       95  mm     G. Age:  38w 5d                CI:         80.2   70 - 86
 OFD:    118.5  mm                                    FL/HC:      21.1   20.8 -

 HC:     340.8  mm     G. Age:  39w 2d       79  %    HC/AC:      0.89   0.92 -

 AC:     383.6  mm     G. Age:  N/A        > 97  %    FL/BPD:     75.7   71 - 87
 FL:      71.9  mm     G. Age:  36w 6d       48  %    FL/AC:      18.7   20 - 24
 HUM:       64  mm     G. Age:  37w 1d       74  %

 Est. FW:    1909  gm      9 lb 1 oz   > 90  %
Gestational Age

 LMP:           37w 4d        Date:  10/06/12                 EDD:   07/13/13
 U/S Today:     38w 2d                                        EDD:   07/08/13
 Best:          36w 6d     Det. By:  Early Ultrasound         EDD:   07/18/13
                                     (11/29/12)
Anatomy

 Cranium:          Appears normal         Aortic Arch:      Not well visualized
 Fetal Cavum:      Appears normal         Ductal Arch:      Appears normal
 Ventricles:       Appears normal         Diaphragm:        Previously seen
 Choroid Plexus:   Previously seen        Stomach:          Appears normal, left
                                                            sided
 Cerebellum:       Previously seen        Abdomen:          Appears normal
 Posterior Fossa:  Previously seen        Abdominal Wall:   Previously seen
 Nuchal Fold:      Not applicable (>20    Cord Vessels:     Previously seen
                   wks GA)
 Face:             Orbits and profile     Kidneys:          Appear normal
                   previously seen
 Lips:             Previously seen        Bladder:          Appears normal
 Heart:            Appears normal         Spine:            Limited views prev
                   (4CH, axis, and                          seen as NL
                   situs)
 RVOT:             Appears normal         Lower             Previously seen
                                          Extremities:
 LVOT:             Appears normal         Upper             Previously seen
                                          Extremities:

 Other:  Technically difficult due to maternal habitus and fetal position.
Cervix Uterus Adnexa

 Cervix:       Not visualized (advanced GA >72wks)
Impression

 SIUP at 36+6 weeks
 Normal interval anatomy; anatomic survey complete except
 for AA
 Normal amniotic fluid volume
 EFW > 90th %tile; AC > 97th %tile; NB at risk to be
 LGA/macrosomic
Recommendations

 Follow-up as clinically indicated

 questions or concerns.
